# Patient Record
Sex: Male | Born: 1990 | Race: Black or African American | Hispanic: No | Marital: Single
Health system: Southern US, Community
[De-identification: ages and names within clinical notes are randomized; demographics above are authoritative.]

---

## 2017-10-30 ENCOUNTER — Other Ambulatory Visit: Payer: Self-pay

## 2017-10-30 ENCOUNTER — Emergency Department (HOSPITAL_COMMUNITY): Payer: Self-pay

## 2017-10-30 ENCOUNTER — Emergency Department (HOSPITAL_COMMUNITY)
Admission: EM | Admit: 2017-10-30 | Discharge: 2017-10-31 | Disposition: A | Discharge status: Home / Self Care | Payer: Self-pay | Attending: Physician Assistant | Admitting: Physician Assistant

## 2017-10-30 DIAGNOSIS — B9789 Other viral agents as the cause of diseases classified elsewhere: Secondary | ICD-10-CM

## 2017-10-30 DIAGNOSIS — J069 Acute upper respiratory infection, unspecified: Secondary | ICD-10-CM | POA: Insufficient documentation

## 2017-10-30 DIAGNOSIS — J029 Acute pharyngitis, unspecified: Secondary | ICD-10-CM | POA: Insufficient documentation

## 2017-10-30 DIAGNOSIS — R05 Cough: Secondary | ICD-10-CM | POA: Insufficient documentation

## 2017-10-30 DIAGNOSIS — B349 Viral infection, unspecified: Secondary | ICD-10-CM | POA: Insufficient documentation

## 2017-10-30 DIAGNOSIS — J028 Acute pharyngitis due to other specified organisms: Secondary | ICD-10-CM

## 2017-10-30 LAB — COMPREHENSIVE METABOLIC PANEL
ALT: 16 U/L — ABNORMAL LOW (ref 17–63)
AST: 19 U/L (ref 15–41)
Albumin: 3.8 g/dL (ref 3.5–5.0)
Alkaline Phosphatase: 61 U/L (ref 38–126)
Anion gap: 10 (ref 5–15)
BUN: 13 mg/dL (ref 6–20)
CO2: 24 mmol/L (ref 22–32)
Calcium: 9.1 mg/dL (ref 8.9–10.3)
Chloride: 102 mmol/L (ref 101–111)
Creatinine, Ser: 1.04 mg/dL (ref 0.61–1.24)
GFR calc Af Amer: >60 mL/min (ref >60)
GFR calc non Af Amer: >60 mL/min (ref >60)
Glucose, Bld: 101 mg/dL — ABNORMAL HIGH (ref 65–99)
Potassium: 4 mmol/L (ref 3.5–5.1)
Sodium: 136 mmol/L (ref 135–145)
Total Bilirubin: 1.8 mg/dL — ABNORMAL HIGH (ref 0.3–1.2)
Total Protein: 8.4 g/dL — ABNORMAL HIGH (ref 6.5–8.1)

## 2017-10-30 LAB — CBC WITH DIFFERENTIAL/PLATELET
Basophils Absolute: 0 10*3/uL (ref 0.0–0.1)
Basophils Relative: 0 %
Eosinophils Absolute: 0 10*3/uL (ref 0.0–0.7)
Eosinophils Relative: 0 %
HCT: 42.4 % (ref 39.0–52.0)
Hemoglobin: 15.6 g/dL (ref 13.0–17.0)
Lymphocytes Relative: 6 %
Lymphs Abs: 1.2 10*3/uL (ref 0.7–4.0)
MCH: 27.9 pg (ref 26.0–34.0)
MCHC: 36.8 g/dL — ABNORMAL HIGH (ref 30.0–36.0)
MCV: 75.8 fL — ABNORMAL LOW (ref 78.0–100.0)
Monocytes Absolute: 1.9 10*3/uL (ref 0.1–1.0)
Monocytes Relative: 10 %
Neutro Abs: 16.8 10*3/uL (ref 1.7–7.7)
Neutrophils Relative %: 84 %
Platelets: 191 10*3/uL (ref 150–400)
RBC: 5.59 MIL/uL (ref 4.22–5.81)
RDW: 15.2 % (ref 11.5–15.5)
WBC: 19.9 10*3/uL — ABNORMAL HIGH (ref 4.0–10.5)

## 2017-10-30 LAB — RAPID STREP SCREEN (MED CTR MEBANE ONLY): STREPTOCOCCUS, GROUP A SCREEN (DIRECT): NEGATIVE

## 2017-10-30 MED ORDER — IBUPROFEN 800 MG PO TABS
800.0000 mg | ORAL_TABLET | Freq: Once | ORAL | Status: AC
  Administered 2017-10-30: 800 mg via ORAL
  Filled 2017-10-30: qty 1

## 2017-10-30 MED ORDER — SODIUM CHLORIDE 0.9 % IV BOLUS (SEPSIS)
1000.0000 mL | Freq: Once | INTRAVENOUS | Status: AC
  Administered 2017-10-30: 1000 mL via INTRAVENOUS

## 2017-10-30 MED ORDER — ONDANSETRON HCL 4 MG/2ML IJ SOLN
4.0000 mg | Freq: Once | INTRAMUSCULAR | Status: AC
  Administered 2017-10-30: 4 mg via INTRAVENOUS
  Filled 2017-10-30: qty 2

## 2017-10-30 MED ORDER — ACETAMINOPHEN 500 MG PO TABS
1000.0000 mg | ORAL_TABLET | Freq: Once | ORAL | Status: AC
  Administered 2017-10-30: 1000 mg via ORAL
  Filled 2017-10-30: qty 2

## 2017-10-30 NOTE — ED Notes (Signed)
Pt unable to provide urine specimen at this time

## 2017-10-30 NOTE — ED Triage Notes (Signed)
Pt BIB [facility] c/o N/V/D and cough x2 days. Pt took 1g of tylenol around 3p. A&Ox4. Ambulatory.

## 2017-10-31 LAB — URINALYSIS, ROUTINE W REFLEX MICROSCOPIC
Bilirubin Urine: NEGATIVE
Glucose, UA: NEGATIVE mg/dL
Hgb urine dipstick: NEGATIVE
Ketones, ur: NEGATIVE mg/dL
Leukocytes, UA: NEGATIVE
Nitrite: NEGATIVE
Protein, ur: 100 mg/dL — AB
Specific Gravity, Urine: 1.031 — ABNORMAL HIGH (ref 1.005–1.030)
Squamous Epithelial / LPF: NONE SEEN
pH: 5 (ref 5.0–8.0)

## 2017-10-31 MED ORDER — IBUPROFEN 800 MG PO TABS: 800.0000 mg | ORAL_TABLET | Freq: Three times a day (TID) | ORAL | 0 refills | Status: AC | PRN

## 2017-10-31 MED ORDER — SODIUM CHLORIDE 0.9 % IV BOLUS (SEPSIS)
1000.0000 mL | Freq: Once | INTRAVENOUS | Status: AC
  Administered 2017-10-31: 1000 mL via INTRAVENOUS

## 2017-10-31 MED ORDER — AMOXICILLIN 875 MG PO TABS: 875.0000 mg | ORAL_TABLET | Freq: Two times a day (BID) | ORAL | 0 refills | Status: AC

## 2017-10-31 MED ORDER — GUAIFENESIN ER 1200 MG PO TB12: 1.0000 | ORAL_TABLET | Freq: Two times a day (BID) | ORAL | 0 refills | Status: AC

## 2017-10-31 MED ORDER — ACETAMINOPHEN-CODEINE 120-12 MG/5ML PO SOLN: 10.0000 mL | ORAL | 0 refills | Status: AC | PRN

## 2017-10-31 NOTE — Discharge Instructions (Signed)
Return here as needed.  Increase your fluid intake and rest as much as possible.  °

## 2017-10-31 NOTE — ED Provider Notes (Signed)
[facility]-EMERGENCY DEPT Provider Note   CSN: [id number] Arrival date & time: 10/30/17  1615     History   Chief Complaint Chief Complaint  Patient presents with  . Nausea  . Diarrhea    HPI Phillip Richmond is a 27 y.o. male.  HPI Patient presents to the emergency department with sore throat cough nausea vomiting with fever.  The patient states that he is also had some mild body aches as well.  Patient states that he did take some Tylenol around 3 PM today.  Patient states he did not take any other medications prior to arrival.  The patient states that the symptoms started 2 days ago.  The patient denies chest pain, shortness of breath, headache,blurred vision, neck pain, weakness, numbness, dizziness, anorexia, edema, abdominal pain, nausea, vomiting, diarrhea, rash, back pain, dysuria, hematemesis, bloody stool, near syncope, or syncope. No past medical history on file.  There are no active problems to display for this patient.        Home Medications    Prior to Admission medications   Medication Sig Start Date End Date Taking? Authorizing Provider  acetaminophen (TYLENOL) 500 MG tablet Take 1,000 mg by mouth every 6 (six) hours as needed for mild pain.   Yes [provider]    Family History No family history on file.  Social History Social History   Tobacco Use  . Smoking status: Not on file  Substance Use Topics  . Alcohol use: Not on file  . Drug use: Not on file     Allergies   Patient has no known allergies.   Review of Systems Review of Systems All other systems negative except as documented in the HPI. All pertinent positives and negatives as reviewed in the HPI.  Physical Exam Updated Vital Signs BP (!) 164/81   Pulse 85   Temp (S) 98.9 F (37.2 C) (Oral)   Resp 18   SpO2 100%   Physical Exam  Constitutional: He is oriented to person, place, and time. He appears well-developed and well-nourished. No distress.    HENT:  Head: Normocephalic and atraumatic.  Mouth/Throat: Mucous membranes are normal. Oropharyngeal exudate and posterior oropharyngeal edema present.  Eyes: Pupils are equal, round, and reactive to light.  Neck: Normal range of motion. Neck supple.  Cardiovascular: Normal rate, regular rhythm and normal heart sounds. Exam reveals no gallop and no friction rub.  No murmur heard. Pulmonary/Chest: Effort normal and breath sounds normal. No stridor. No respiratory distress. He has no wheezes. He has no rales.  Neurological: He is alert and oriented to person, place, and time. He exhibits normal muscle tone. Coordination normal.  Skin: Skin is warm and dry. Capillary refill takes less than 2 seconds. No rash noted. No erythema.  Psychiatric: He has a normal mood and affect. His behavior is normal.  Nursing note and vitals reviewed.    ED Treatments / Results  Labs (all labs ordered are listed, but only abnormal results are displayed) Labs Reviewed  COMPREHENSIVE METABOLIC PANEL - Abnormal; Notable for the following components:      Result Value   Glucose, Bld 101 (*)    Total Protein 8.4 (*)    ALT 16 (*)    Total Bilirubin 1.8 (*)    All other components within normal limits  CBC WITH DIFFERENTIAL/PLATELET - Abnormal; Notable for the following components:   WBC 19.9 (*)    MCV 75.8 (*)    MCHC 36.8 (*)  All other components within normal limits  URINALYSIS, ROUTINE W REFLEX MICROSCOPIC - Abnormal; Notable for the following components:   Color, Urine AMBER (*)    Specific Gravity, Urine 1.031 (*)    Protein, ur 100 (*)    Bacteria, UA RARE (*)    All other components within normal limits  RAPID STREP SCREEN (NOT AT [facility])  CULTURE, GROUP A STREP [facility])    EKG  EKG Interpretation None       Radiology Dg Chest 2 View  Result Date: 10/30/2017 CLINICAL DATA:  Acute onset of cough, nausea, vomiting and diarrhea. EXAM: CHEST  2 VIEW COMPARISON:  None. FINDINGS: The lungs  are well-aerated and clear. There is no evidence of focal opacification, pleural effusion or pneumothorax. The heart is normal in size; the mediastinal contour is within normal limits. No acute osseous abnormalities are seen. IMPRESSION: No acute cardiopulmonary process seen. Electronically Signed   By: Roanna Raider M.D.   On: 10/30/2017 22:40    Procedures Procedures (including critical care time)  Medications Ordered in ED Medications  sodium chloride 0.9 % bolus 1,000 mL (0 mLs Intravenous Stopped 10/31/17 0031)  ondansetron (ZOFRAN) injection 4 mg (4 mg Intravenous Given 10/30/17 2216)  acetaminophen (TYLENOL) tablet 1,000 mg (1,000 mg Oral Given 10/30/17 2313)  ibuprofen (ADVIL,MOTRIN) tablet 800 mg (800 mg Oral Given 10/30/17 2313)  sodium chloride 0.9 % bolus 1,000 mL (1,000 mLs Intravenous New Bag/Given 10/31/17 0134)     Initial Impression / Assessment and Plan / ED Course  I have reviewed the triage vital signs and the nursing notes.  Pertinent labs & imaging results that were available during my care of the patient were reviewed by me and considered in my medical decision making (see chart for details).    We will treat his viral URI he does have exudative pharyngitis which is concerning so we will treat even though his strep was negative.  An influenza-like illness generally does not have exudative components to it.  Although I do feel that there is a viral component to this as well   Final Clinical Impressions(s) / ED Diagnoses   Final diagnoses:  Pharyngitis due to other organism  Viral URI with cough    ED Discharge Orders    None       Charlestine Night, PA-C 10/31/17 0239    Abelino Derrick, MD 11/03/17 [phone]

## 2017-11-02 LAB — CULTURE, GROUP A STREP (THRC)

## 2018-10-21 IMAGING — CR DG CHEST 2V
2 series · 2 of 2 positions shown · non-contrast
Comparison: None.

CLINICAL DATA: Acute onset of cough, nausea, vomiting and diarrhea.

EXAM:
CHEST  2 VIEW

[w chest pa]
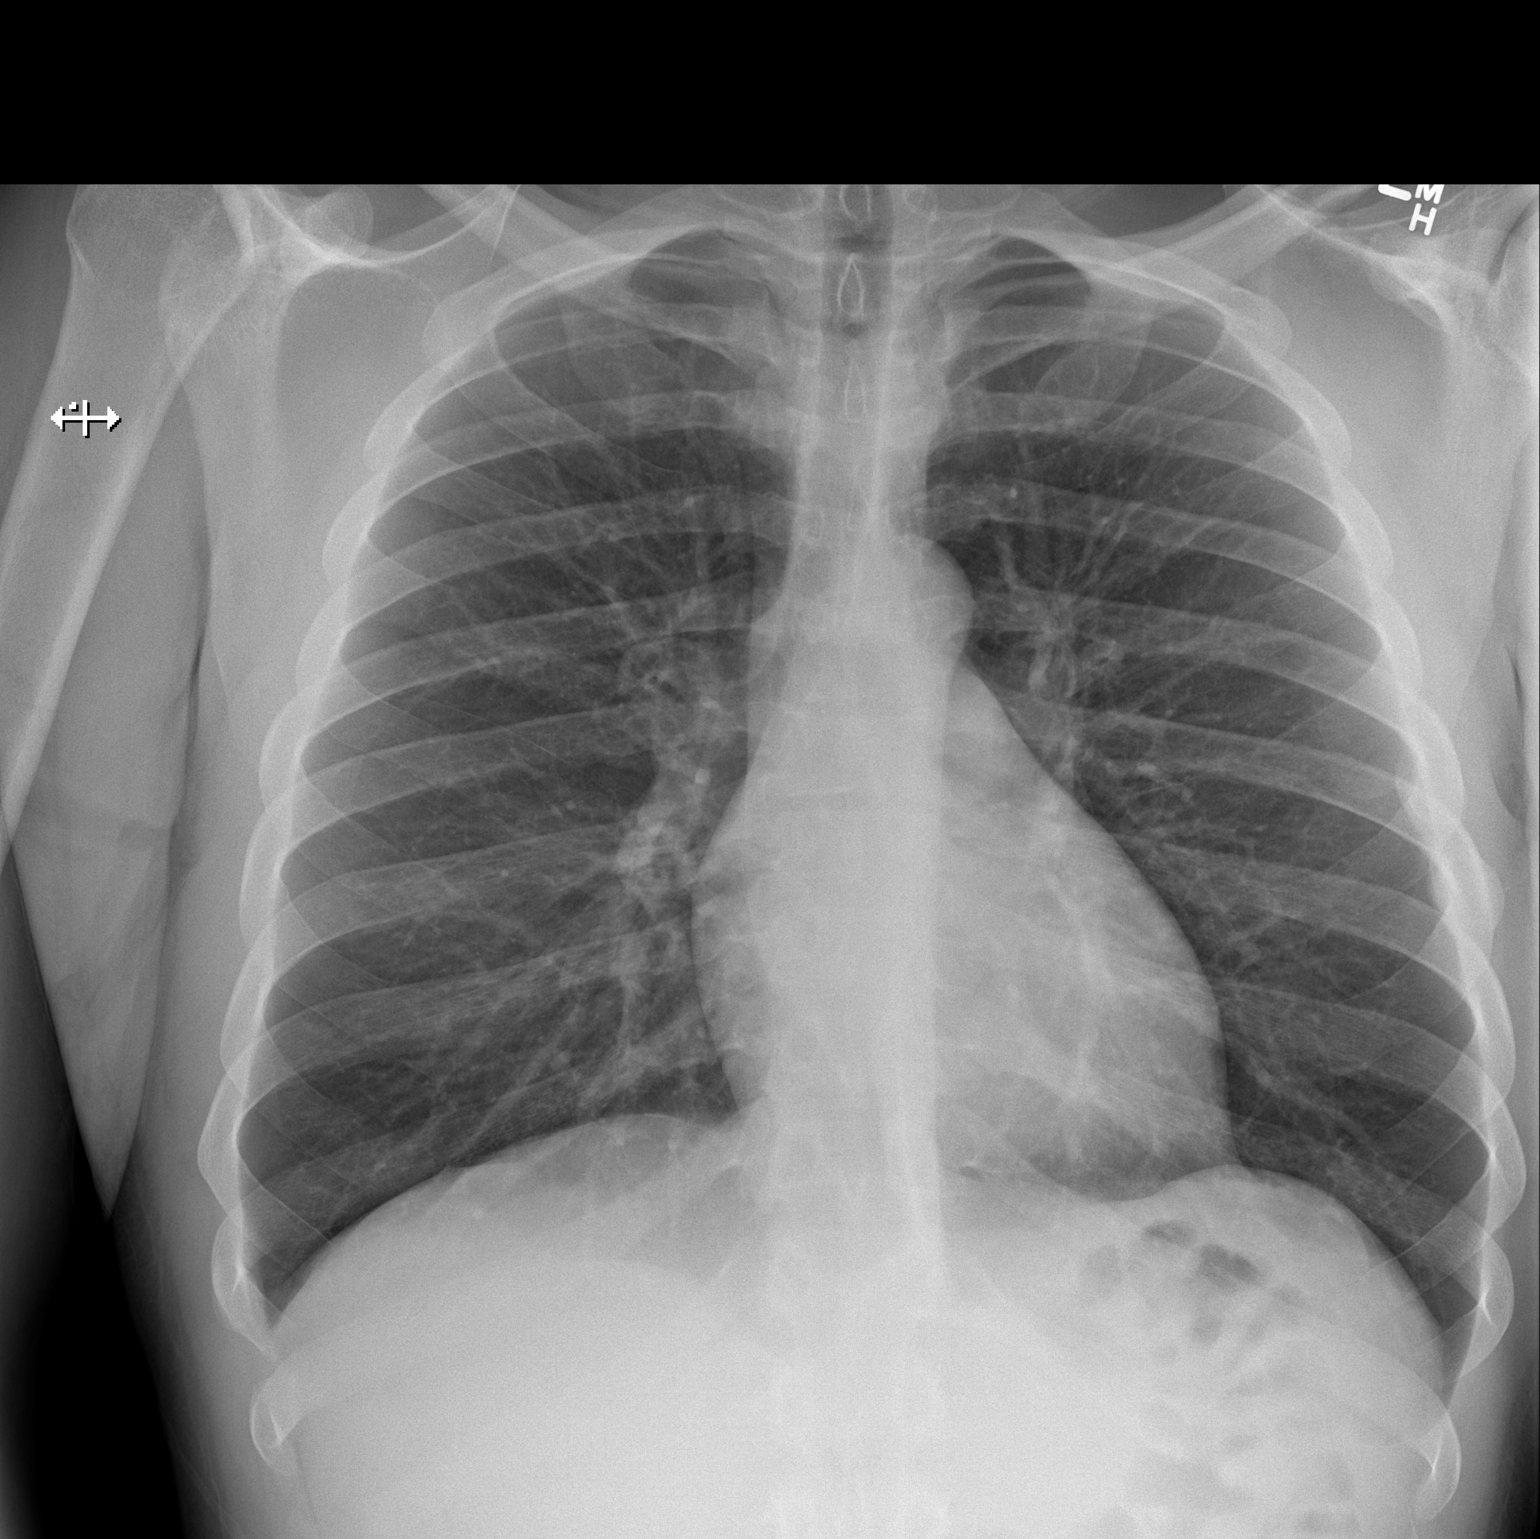

[w chest lat]
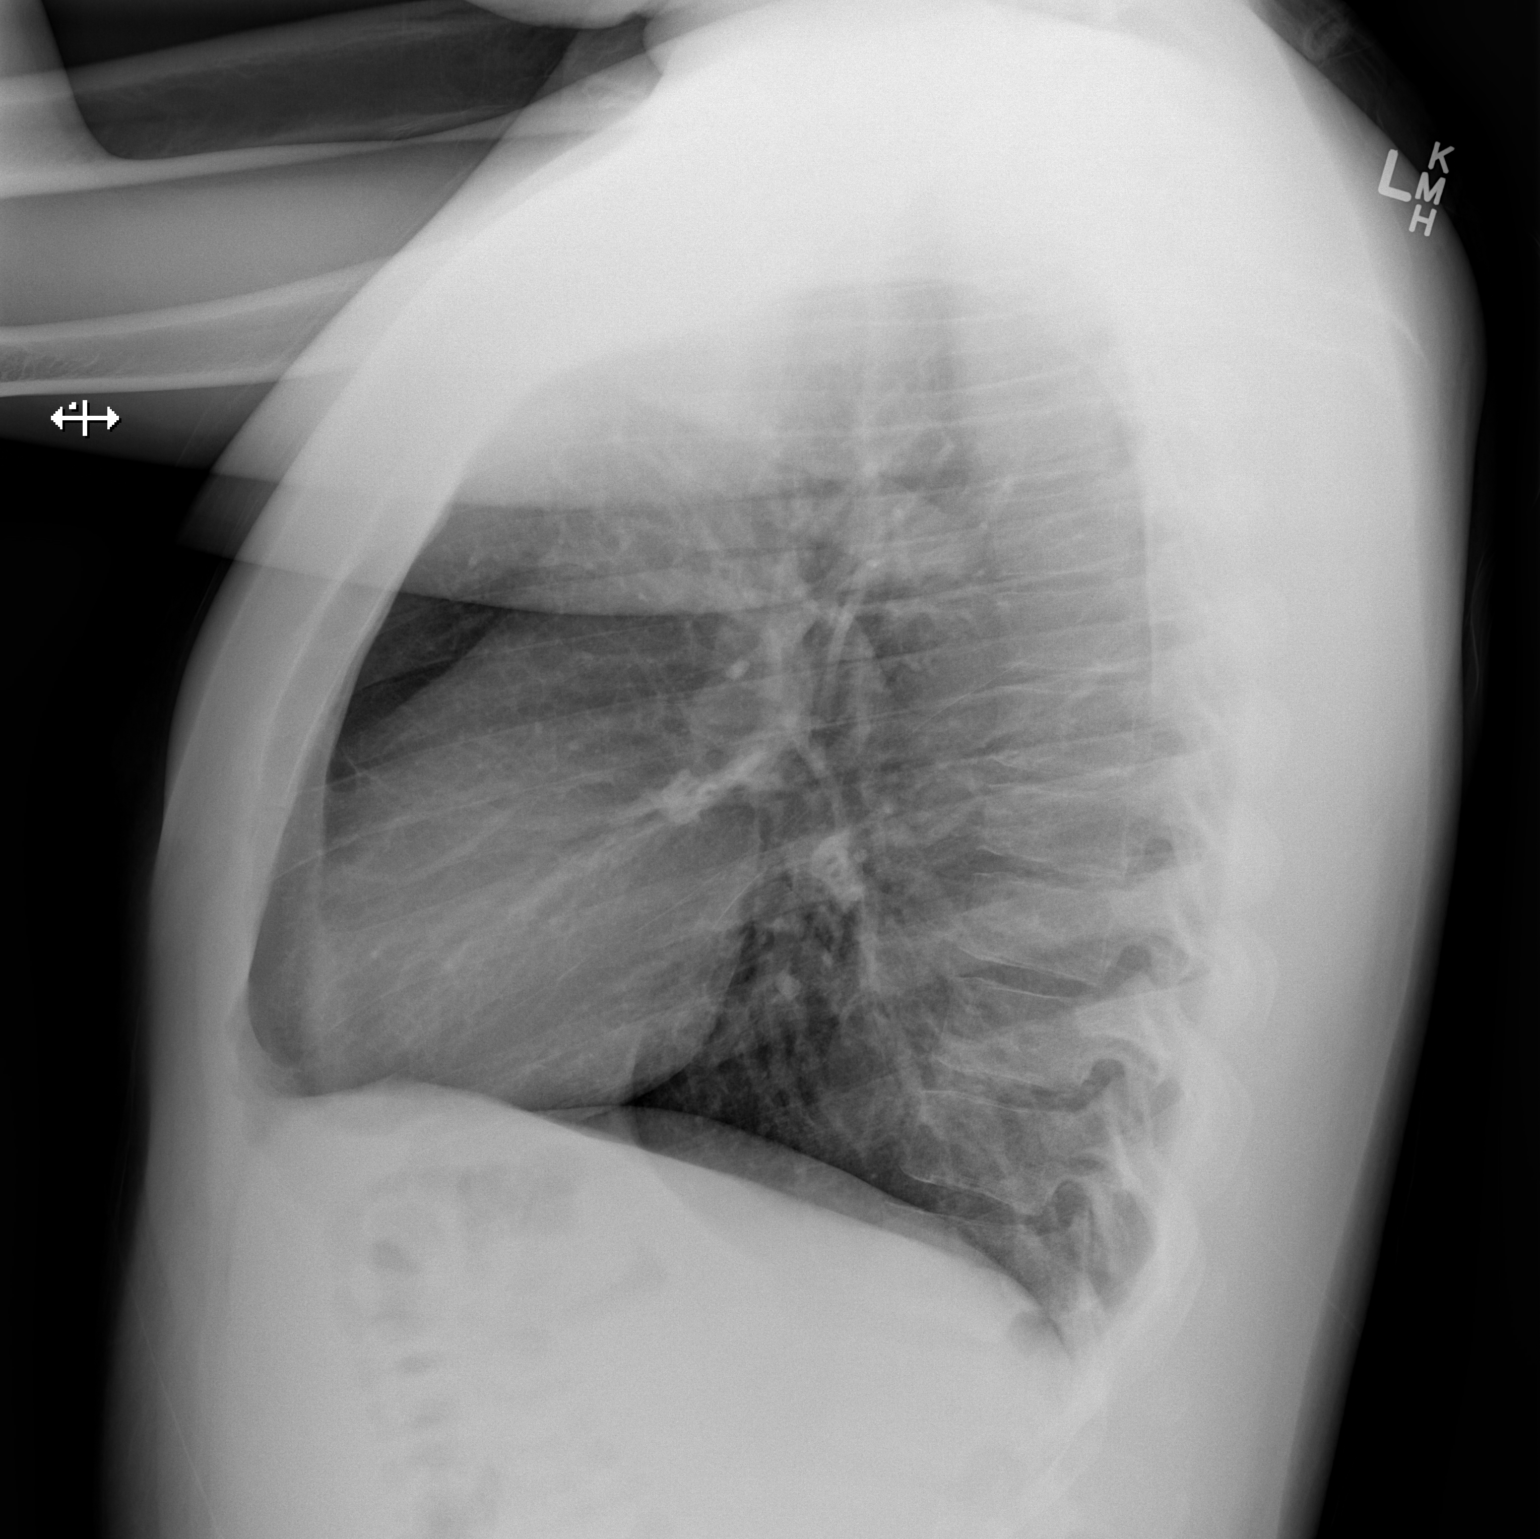

[2 of 2 positions shown; findings below may reference images not displayed]

FINDINGS: The lungs are well-aerated and clear. There is no evidence of focal
opacification, pleural effusion or pneumothorax.

The heart is normal in size; the mediastinal contour is within
normal limits. No acute osseous abnormalities are seen.
IMPRESSION: No acute cardiopulmonary process seen.
